# Patient Record
Sex: Male | Born: 2000 | Race: Black or African American | Hispanic: No | Marital: Single | Smoking: Former smoker
Health system: Southern US, Community
[De-identification: ages and names within clinical notes are randomized; demographics above are authoritative.]

## PROBLEM LIST (undated history)

## (undated) DIAGNOSIS — N5089 Other specified disorders of the male genital organs: Secondary | ICD-10-CM

## (undated) HISTORY — PX: NO PAST SURGERIES: SHX2092

---
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
## 2024-04-07 ENCOUNTER — Other Ambulatory Visit: Payer: Self-pay

## 2024-04-07 ENCOUNTER — Ambulatory Visit
Admission: EM | Admit: 2024-04-07 | Discharge: 2024-04-07 | Disposition: A | Discharge status: Home / Self Care | Attending: Internal Medicine | Admitting: Internal Medicine

## 2024-04-07 DIAGNOSIS — J0110 Acute frontal sinusitis, unspecified: Secondary | ICD-10-CM | POA: Diagnosis not present

## 2024-04-07 DIAGNOSIS — B356 Tinea cruris: Secondary | ICD-10-CM | POA: Diagnosis not present

## 2024-04-07 HISTORY — DX: Other specified disorders of the male genital organs: N50.89

## 2024-04-07 MED ORDER — FLUTICASONE PROPIONATE 50 MCG/ACT NA SUSP: 1.0000 | Freq: Every day | NASAL | 0 refills | Status: DC

## 2024-04-07 MED ORDER — KETOCONAZOLE 2 % EX CREA: 1.0000 | TOPICAL_CREAM | Freq: Every day | CUTANEOUS | 0 refills | Status: AC

## 2024-04-07 MED ORDER — CETIRIZINE HCL 10 MG PO TABS: 10.0000 mg | ORAL_TABLET | Freq: Every day | ORAL | 0 refills | Status: DC

## 2024-04-07 MED ORDER — AMOXICILLIN-POT CLAVULANATE 875-125 MG PO TABS: 1.0000 | ORAL_TABLET | Freq: Two times a day (BID) | ORAL | 0 refills | Status: AC

## 2024-04-07 NOTE — ED Provider Notes (Signed)
 [facility]  Note:  This document was prepared using Dragon voice recognition software and may include unintentional dictation errors.  MRN: [id number] DOB: 2001/09/26 DATE: 04/07/24   Subjective:  Chief Complaint:  Chief Complaint  Patient presents with   Rash     HPI: Thomas Bray is a 23 y.o. male presenting for multiple issues.   First, patient reports sinus pressure/pain for the past 3 weeks. Patient reports frontal headaches. He states he was seen at the ER for the same issue and was prescribed something at that time. He states he lost the medication and was unable to complete it as directed. He reports ongoing sinus pressure and congestion since then. He denies cough or recent fever. Reports possible fever at the early onset weeks ago. He hast not taken anything OTC for his symptoms.   Patient also presents for a rash in his groin/bilateral inner thighs. Reports pruritus, but no pain. He states that the rash avoids his genitals. States he works outside a lot. Denies fever, nausea/vomiting, penile discharge, sore throat, cough, otalgia. Endorses rash, sinus pressure/pain, congestion, headaches. Presents NAD.  Prior to Admission medications   Medication Sig Start Date End Date Taking? Authorizing Provider  amoxicillin-clavulanate (AUGMENTIN) 875-125 MG tablet Take 1 tablet by mouth every 12 (twelve) hours for 7 days. 04/07/24 04/14/24 Yes Delane Wessinger P, PA-C  cetirizine (ZYRTEC) 10 MG tablet Take 1 tablet (10 mg total) by mouth daily. 04/07/24  Yes Katherine Tout P, PA-C  fluticasone (FLONASE) 50 MCG/ACT nasal spray Place 1 spray into both nostrils daily. 04/07/24  Yes Jahmiyah Dullea P, PA-C     No Known Allergies  History:   Past Medical History:  Diagnosis Date   Testicular microlithiasis      History reviewed. No pertinent surgical history.  History reviewed. No pertinent family history.  Social History   Tobacco Use   Smoking status: Former  Substance  Use Topics   Alcohol use: Yes    Comment: social   Drug use: Not Currently    Review of Systems  Constitutional:  Negative for fever.  HENT:  Positive for congestion, sinus pressure and sinus pain. Negative for ear pain and sore throat.   Respiratory:  Negative for cough.   Gastrointestinal:  Negative for abdominal pain, nausea and vomiting.  Genitourinary:  Negative for penile discharge.  Skin:  Positive for rash.     Objective:   Vitals: BP (!) 148/84 (BP Location: Right Arm)   Pulse 68   Temp 99.2 F (37.3 C) (Oral)   Resp 16   SpO2 96%   Physical Exam Constitutional:      General: He is not in acute distress.    Appearance: Normal appearance. He is well-developed and normal weight. He is not ill-appearing or toxic-appearing.  HENT:     Head: Normocephalic and atraumatic.     Right Ear: Tympanic membrane and ear canal normal.     Left Ear: Tympanic membrane and ear canal normal.     Nose:     Right Turbinates: Enlarged.     Left Turbinates: Enlarged.     Right Sinus: Frontal sinus tenderness present.     Left Sinus: Frontal sinus tenderness present.   Cardiovascular:     Rate and Rhythm: Normal rate and regular rhythm.     Heart sounds: Normal heart sounds.  Pulmonary:     Effort: Pulmonary effort is normal.     Breath sounds: Normal breath sounds.     Comments:  Clear to auscultation bilaterally  Abdominal:     General: Bowel sounds are normal.     Palpations: Abdomen is soft.     Tenderness: There is no abdominal tenderness.   Skin:    General: Skin is warm and dry.     Findings: Rash present. Rash is macular and papular.     Comments: Patient has pink maculopapular rash of bilateral inner thighs. Slight flaking noted on exam. No discharge.    Neurological:     General: No focal deficit present.     Mental Status: He is alert.   Psychiatric:        Mood and Affect: Mood and affect normal.     Results:  Labs: No results found for this or any  previous visit (from the past 24 hours).  Radiology: No results found.   UC Course/Treatments:  Procedures: Procedures   Medications Ordered in UC: Medications - No data to display   Assessment and Plan :     ICD-10-CM   1. Tinea cruris  B35.6     2. Acute non-recurrent frontal sinusitis  J01.10      Tinea cruris Afebrile, nontoxic-appearing, NAD. VSS. DDX includes but not limited to: tinea cruris, intertrigo, cellulitis, abscess, STD Rash appears consistent with tinea cruris. Ketoconazole 2% 1 application every day was prescribed. Patient instructed to keep the area dry as possible while working. Strict ED precautions were given and patient verbalized understanding.  Acute non-recurrent frontal sinusitis Afebrile, nontoxic-appearing, NAD. VSS. DDX includes but not limited to: COVID, flu, viral URI, sinusitis, allergic rhinitis Patient was given RX for Augmentin per the EMR for frontal sinusitis that was never completed. Patient reports losing the medication after a few doses. RX was sent for Augmentin 875mg  BID given ongoing symptoms. He was also prescribed Flonase 1 spray each nostril every day and Zyrtec 10mg  every day for congestion for suspect allergy component. Strict ED precautions were given and patient verbalized understanding.  ED Discharge Orders          Ordered    amoxicillin-clavulanate (AUGMENTIN) 875-125 MG tablet  Every 12 hours        04/07/24 1107    fluticasone (FLONASE) 50 MCG/ACT nasal spray  Daily        04/07/24 1107    cetirizine (ZYRTEC) 10 MG tablet  Daily        04/07/24 1107    ketoconazole (NIZORAL) 2 % cream  Daily        04/07/24 1108             PDMP not reviewed this encounter.     Darby Fleeman P, PA-C 04/07/24 1120

## 2024-04-07 NOTE — Discharge Instructions (Addendum)
 You have been prescribed ketoconazole cream for your rash. This is an antifungal often used to treat jock itch. Please take as directed.   I have also resent the prescription for Augmentin that the ED prescribed you for a sinus infection. Please take as directed. I have also sent a nasal spray and antihistamine to help with the pressure and congestion.

## 2024-04-07 NOTE — ED Triage Notes (Signed)
 C/O rash to groin area x 2 weeks. Patient also complaining of nasal congestion for more then 3 weeks.

## 2024-05-15 DIAGNOSIS — E7800 Pure hypercholesterolemia, unspecified: Secondary | ICD-10-CM | POA: Insufficient documentation

## 2024-05-30 ENCOUNTER — Ambulatory Visit: Admission: EM | Admit: 2024-05-30 | Discharge: 2024-05-30 | Disposition: A | Discharge status: Home / Self Care

## 2024-05-30 DIAGNOSIS — Z711 Person with feared health complaint in whom no diagnosis is made: Secondary | ICD-10-CM

## 2024-05-30 NOTE — Discharge Instructions (Signed)
 The bumps you have around the head of your penis are called Fordyce spots.  He had similar bumps around the edges of your lips as well.  They are sebaceous glands that provide moisture to the skin around them and are visibly noticeable in about 90% of the human population.  They do not require treatment.  Thank you for visiting [facility] today.

## 2024-05-30 NOTE — ED Triage Notes (Signed)
 Pt states he has had a rash in his groin that has been going on for about a month. He has had itching and swelling in the area of the rash. He has tried the two cream that were prescribed and the second one helped some.

## 2024-05-30 NOTE — ED Provider Notes (Signed)
 [facility]    CSN: [id number] Arrival date & time: 05/30/24  1541    HISTORY   Chief Complaint  Patient presents with   Rash   HPI Thomas Bray is a pleasant, 23 y.o. male who presents to urgent care today. Patient complains of a rash at the base of the head of his penis that has been present for about a month.  States he has never noticed it before.  Patient states the area has been tingly and he has been applying cream to the area without meaningful relief.  Of note, patient was diagnosed with tinea cruris several months ago, has been treated with topical ketoconazole , topical terbinafine in terconazole p.o.  States this rash has resolved.  The history is provided by the patient.  Rash  Past Medical History:  Diagnosis Date   Testicular microlithiasis    Patient Active Problem List   Diagnosis Date Noted   Pure hypercholesterolemia 05/15/2024   Cyst of skin 08/21/2013   Distal radius fracture 07/03/2013   ADHD (attention deficit hyperactivity disorder) 05/30/2013   Oppositional defiant disorder 05/30/2013   Past Surgical History:  Procedure Laterality Date   NO PAST SURGERIES      Home Medications    Prior to Admission medications   Medication Sig Start Date End Date Taking? Authorizing Provider  methocarbamol (ROBAXIN) 500 MG tablet Take 500 mg by mouth. Patient taking differently: Take 500 mg by mouth daily as needed. 03/11/24  Yes [provider]    Family History History reviewed. No pertinent family history. Social History Social History   Tobacco Use   Smoking status: Former    Passive exposure: Current  Vaping Use   Vaping status: Never Used  Substance Use Topics   Alcohol use: Yes    Comment: social   Drug use: Not Currently   Allergies   Cetirizine   Review of Systems Review of Systems  Skin:  Positive for rash.   Pertinent findings revealed after performing a 14 point review of systems has been noted in the history of  present illness.  Physical Exam Vital Signs BP (!) 149/76 (BP Location: Right Arm)   Pulse 66   Temp 99 F (37.2 C) (Oral)   Resp 16   SpO2 96%   No data found.  Physical Exam Vitals and nursing note reviewed.  Constitutional:      General: He is awake. He is not in acute distress.    Appearance: Normal appearance. He is well-developed and well-groomed.  Genitourinary:    Comments: Fordyce spots appreciated at the base of glans penis without surrounding erythema or irritation. Neurological:     Mental Status: He is alert.  Psychiatric:        Behavior: Behavior is cooperative.     Visual Acuity Right Eye Distance:   Left Eye Distance:   Bilateral Distance:    Right Eye Near:   Left Eye Near:    Bilateral Near:     UC Couse / Diagnostics / Procedures:     Radiology No results found.  Procedures Procedures (including critical care time) EKG  Pending results:  Labs Reviewed - No data to display  Medications Ordered in UC: Medications - No data to display  UC Diagnoses / Final Clinical Impressions(s)   I have reviewed the triage vital signs and the nursing notes.  Pertinent labs & imaging results that were available during my care of the patient were reviewed by me and considered in my  medical decision making (see chart for details).    Final diagnoses:  Physically well but worried   Patient advised that the bumps he is concerned about are sebaceous glands and part of his normal anatomy.  No treatment indicated.  Return precautions advised.  Please see discharge instructions below for details of plan of care as provided to patient. ED Prescriptions   None    PDMP not reviewed this encounter.  Pending results:  Labs Reviewed - No data to display    Discharge Instructions      The bumps you have around the head of your penis are called Fordyce spots.  He had similar bumps around the edges of your lips as well.  They are sebaceous glands that  provide moisture to the skin around them and are visibly noticeable in about 90% of the human population.  They do not require treatment.  Thank you for visiting [facility] today.    Disposition Upon Discharge:  Condition: stable for discharge home  Patient presented with an acute illness with associated systemic symptoms and significant discomfort requiring urgent management. In my opinion, this is a condition that a prudent lay person (someone who possesses an average knowledge of health and medicine) may potentially expect to result in complications if not addressed urgently such as respiratory distress, impairment of bodily function or dysfunction of bodily organs.   Routine symptom specific, illness specific and/or disease specific instructions were discussed with the patient and/or caregiver at length.   As such, the patient has been evaluated and assessed, work-up was performed and treatment was provided in alignment with urgent care protocols and evidence based medicine.  Patient/parent/caregiver has been advised that the patient may require follow up for further testing and treatment if the symptoms continue in spite of treatment, as clinically indicated and appropriate.  Patient/parent/caregiver has been advised to return to the [facility] or PCP if no better; to PCP or the Emergency Department if new signs and symptoms develop, or if the current signs or symptoms continue to change or worsen for further workup, evaluation and treatment as clinically indicated and appropriate  The patient will follow up with their current PCP if and as advised. If the patient does not currently have a PCP we will assist them in obtaining one.   The patient may need specialty follow up if the symptoms continue, in spite of conservative treatment and management, for further workup, evaluation, consultation and treatment as clinically indicated and appropriate.  Patient/parent/caregiver verbalized  understanding and agreement of plan as discussed.  All questions were addressed during visit.  Please see discharge instructions below for further details of plan.  This office note has been dictated using Teaching laboratory technician.  Unfortunately, this method of dictation can sometimes lead to typographical or grammatical errors.  I apologize for your inconvenience in advance if this occurs.  Please do not hesitate to reach out to me if clarification is needed.      Joesph Shaver Scales, PA-C 05/30/24 1620

## 2024-09-04 ENCOUNTER — Encounter: Payer: Self-pay | Admitting: Emergency Medicine

## 2024-09-04 ENCOUNTER — Ambulatory Visit
Admission: EM | Admit: 2024-09-04 | Discharge: 2024-09-04 | Disposition: A | Discharge status: Home / Self Care | Attending: Internal Medicine | Admitting: Internal Medicine

## 2024-09-04 DIAGNOSIS — R590 Localized enlarged lymph nodes: Secondary | ICD-10-CM | POA: Diagnosis not present

## 2024-09-04 LAB — POCT URINE DIPSTICK
Bilirubin, UA: NEGATIVE
Blood, UA: NEGATIVE
Glucose, UA: NEGATIVE mg/dL
Ketones, POC UA: NEGATIVE mg/dL
Leukocytes, UA: NEGATIVE
Nitrite, UA: NEGATIVE
POC PROTEIN,UA: NEGATIVE
Spec Grav, UA: 1.025 (ref 1.010–1.025)
Urobilinogen, UA: 1 U/dL
pH, UA: 6.5 (ref 5.0–8.0)

## 2024-09-04 NOTE — ED Triage Notes (Signed)
 Patient c/o a knot in his right lower abdominal area x 5 days.  Denies any pain.  The area is tender to touch.  Denies any OTC meds.

## 2024-09-04 NOTE — Discharge Instructions (Signed)
 Your swab and blood work were sent to the lab for further testing. Someone from our office will call you with your results.  You should avoid all sexual activity until you have been notified of all your results and have undergone any necessary treatment.  If you are positive, it is recommended that you inform all sexual partners so they can treat be treated as well before having sex again.

## 2024-09-04 NOTE — ED Provider Notes (Signed)
 [facility]  Note:  This document was prepared using Dragon voice recognition software and may include unintentional dictation errors.  MRN: [id number] DOB: 2001/02/06 DATE: 09/04/24   Subjective:  Chief Complaint:  Chief Complaint  Patient presents with   Hernia     HPI: Thomas Bray is a 23 y.o. male presenting for knot of his right lower abdomen for the past 5 days. Patient concerned about possible hernia. Patient states he noticed the swelling while in the shower 5 days ago. Reports no pain unless palpated. He has not taken anything for the pain. Reports no known injury or inciting cause.  Of note, patient was diagnosed with gonorrhea at the end of October 2025.  Reports completing treatment as directed and symptoms resolved.  Denies fever, nausea/vomiting, abdominal pain, dysuria, penile discharge. Endorses swelling. Presents NAD.  Prior to Admission medications   Not on File     Allergies  Allergen Reactions   Cetirizine  Hives and Other (See Comments)    unknown    History:   Past Medical History:  Diagnosis Date   Testicular microlithiasis      Past Surgical History:  Procedure Laterality Date   NO PAST SURGERIES      History reviewed. No pertinent family history.  Social History   Tobacco Use   Smoking status: Former    Passive exposure: Current   Smokeless tobacco: Never  Vaping Use   Vaping status: Never Used  Substance Use Topics   Alcohol use: Yes    Comment: social   Drug use: Not Currently    Review of Systems  Constitutional:  Negative for fever.  Gastrointestinal:  Negative for abdominal pain, nausea and vomiting.  Genitourinary:  Negative for dysuria, genital sores, penile discharge and testicular pain.  Hematological:  Positive for adenopathy.     Objective:   Vitals: BP (!) 145/71 (BP Location: Right Arm)   Pulse (!) 55   Temp 98.2 F (36.8 C) (Oral)   Resp 18   Ht 6' 1 (1.854 m)   Wt 170 lb (77.1 kg)   SpO2 95%    BMI 22.43 kg/m   Physical Exam Exam conducted with a chaperone present [employer], [place]).  Constitutional:      General: He is not in acute distress.    Appearance: Normal appearance. He is well-developed and normal weight. He is not ill-appearing or toxic-appearing.  HENT:     Head: Normocephalic and atraumatic.  Cardiovascular:     Rate and Rhythm: Normal rate and regular rhythm.     Heart sounds: Normal heart sounds.  Pulmonary:     Effort: Pulmonary effort is normal.     Breath sounds: Normal breath sounds.     Comments: Clear to auscultation bilaterally  Abdominal:     General: Bowel sounds are normal.     Palpations: Abdomen is soft.     Tenderness: There is no abdominal tenderness.     Hernia: No hernia is present. There is no hernia in the left inguinal area, right femoral area, left femoral area or right inguinal area.     Comments: No acute abdomen   Genitourinary:    Penis: Normal.      Testes: Normal.     Comments: Patient has right sided inguinal lymphadenopathy. TTP. Lymphadenopathy:     Lower Body: Right inguinal adenopathy present.  Skin:    General: Skin is warm and dry.  Neurological:     General: No focal deficit present.  Mental Status: He is alert.  Psychiatric:        Mood and Affect: Mood and affect normal.     Results:  Labs: Results for orders placed or performed during the hospital encounter of 09/04/24 (from the past 24 hours)  POCT URINE DIPSTICK     Status: None   Collection Time: 09/04/24 10:44 AM  Result Value Ref Range   Color, UA yellow yellow   Clarity, UA clear clear   Glucose, UA negative negative mg/dL   Bilirubin, UA negative negative   Ketones, POC UA negative negative mg/dL   Spec Grav, UA 8.974 8.989 - 1.025   Blood, UA negative negative   pH, UA 6.5 5.0 - 8.0   POC PROTEIN,UA negative negative, trace   Urobilinogen, UA 1.0 0.2 or 1.0 E.U./dL   Nitrite, UA Negative Negative   Leukocytes, UA Negative Negative     Radiology: No results found.   UC Course/Treatments:  Procedures: Procedures   Medications Ordered in UC: Medications - No data to display   Assessment and Plan :     ICD-10-CM   1. Inguinal lymphadenopathy  R59.0 CBC with Differential/Platelet    POCT URINE DIPSTICK    Cytology Ancillary Only -Urethral; GC / Chlamydia, Trichomonas    RPR    HIV Antibody (routine testing w rflx)    HIV4GL Save Tube    Mycoplasma genitalium, NAA, Swab (Send to [facility] thru [facility])    CBC with Differential/Platelet    POCT URINE DIPSTICK    Cytology Ancillary Only -Urethral; GC / Chlamydia, Trichomonas    RPR    HIV Antibody (routine testing w rflx)    HIV4GL Save Tube    Mycoplasma genitalium, NAA, Swab (Send to [facility] thru [facility])     Inguinal lymphadenopathy Afebrile, nontoxic-appearing, NAD. VSS. DDX includes but not limited to: STD, HIV, RPR, cystitis Right sided inguinal lymphadenopathy.  Patient is currently asymptomatic except for tenderness to palpation of the lymph nodes.  Patient was recently diagnosed with gonorrhea in October 2025, but reports he completed treatment and symptoms had resolved.  Patient is sexually active and does have unprotected sex.  HIV, RPR, cytology, and mycoplasma are pending given inguinal lymphadenopathy.  CBC is pending to evaluate WBC. UA was negative.  Safe sex precautions advised.  If workup is negative, recommend he follow-up with PCP for further evaluation and possible referral. Recommend OTC analgesics as needed for pain. Strict ED precautions were given and patient verbalized understanding.   ED Discharge Orders     None        PDMP not reviewed this encounter.     Basilia Ulanda SQUIBB, PA-C 09/04/24 1107

## 2024-09-05 LAB — CYTOLOGY, (ORAL, ANAL, URETHRAL) ANCILLARY ONLY
Comment: NEGATIVE
Comment: NEGATIVE
Comment: NORMAL

## 2024-09-05 LAB — CBC WITH DIFFERENTIAL/PLATELET
Basophils Absolute: 0 x10E3/uL (ref 0.0–0.2)
Basos: 1 %
EOS (ABSOLUTE): 0.2 x10E3/uL (ref 0.0–0.4)
Eos: 5 %
Hematocrit: 45.4 % (ref 37.5–51.0)
Hemoglobin: 15.1 g/dL (ref 13.0–17.7)
Immature Grans (Abs): 0 x10E3/uL (ref 0.0–0.1)
Immature Granulocytes: 0 %
Lymphocytes Absolute: 1.3 x10E3/uL (ref 0.7–3.1)
Lymphs: 34 %
MCH: 29.8 pg (ref 26.6–33.0)
MCHC: 33.3 g/dL (ref 31.5–35.7)
MCV: 90 fL (ref 79–97)
Monocytes Absolute: 0.4 x10E3/uL (ref 0.1–0.9)
Monocytes: 9 %
Neutrophils Absolute: 2 x10E3/uL (ref 1.4–7.0)
Neutrophils: 51 %
Platelets: 167 x10E3/uL (ref 150–450)
RBC: 5.06 x10E6/uL (ref 4.14–5.80)
RDW: 12.7 % (ref 11.6–15.4)
WBC: 3.9 x10E3/uL (ref 3.4–10.8)

## 2024-09-05 LAB — SYPHILIS: RPR W/REFLEX TO RPR TITER AND TREPONEMAL ANTIBODIES, TRADITIONAL SCREENING AND DIAGNOSIS ALGORITHM: RPR Ser Ql: NONREACTIVE

## 2024-09-05 LAB — HIV ANTIBODY (ROUTINE TESTING W REFLEX): HIV Screen 4th Generation wRfx: NONREACTIVE

## 2024-09-07 ENCOUNTER — Ambulatory Visit: Payer: Self-pay | Admitting: Internal Medicine

## 2024-09-12 LAB — MISC LABCORP TEST (SEND OUT): Labcorp test code: 180076

## 2024-10-19 ENCOUNTER — Ambulatory Visit
Admission: EM | Admit: 2024-10-19 | Discharge: 2024-10-19 | Disposition: A | Discharge status: Home / Self Care | Attending: Internal Medicine | Admitting: Internal Medicine

## 2024-10-19 ENCOUNTER — Other Ambulatory Visit: Payer: Self-pay

## 2024-10-19 DIAGNOSIS — R197 Diarrhea, unspecified: Secondary | ICD-10-CM | POA: Diagnosis present

## 2024-10-19 MED ORDER — LOPERAMIDE HCL 2 MG PO CAPS: ORAL_CAPSULE | ORAL | 0 refills | Status: AC

## 2024-10-19 NOTE — ED Provider Notes (Signed)
 " [facility]  Note:  This document was prepared using Dragon voice recognition software and may include unintentional dictation errors.  MRN: [id number] DOB: 2000/11/17 DATE: 10/19/2024   Subjective:  Chief Complaint:  Chief Complaint  Patient presents with   Diarrhea     HPI: Thomas Bray is a 24 y.o. male presenting for diarrhea for the past 5 days. Patient states he woke up in the middle of the night on Sunday and had abdominal cramping. He states he had an episode of diarrhea when he went to the restroom. Reports ongoing diarrhea since then. He reports loose, watery stools with no blood. Reports 2-3 bowel movements a day. Last BM was this morning and diarrhea in consistency. He reports intermittent abdominal cramping. Diarrhea usually occurs after eating. No one with similar symptoms. No recent travel out the country or ABX use. He has not taken anything for his symptoms. Reports eating crackers and drinking juice. Denies fever, nausea/vomiting, abdominal pain, bloody stools. Endorses diarrhea. Presents NAD.  Prior to Admission medications  Not on File     Allergies[1]  History:   Past Medical History:  Diagnosis Date   Testicular microlithiasis      Past Surgical History:  Procedure Laterality Date   NO PAST SURGERIES      History reviewed. No pertinent family history.  Social History[2]  Review of Systems  Constitutional:  Positive for appetite change. Negative for fever.  Gastrointestinal:  Positive for diarrhea. Negative for abdominal pain, blood in stool, nausea and vomiting.     Objective:   Vitals: BP (!) 146/91 (BP Location: Right Arm)   Pulse (!) 51   Temp 98.4 F (36.9 C) (Oral)   Resp 16   SpO2 97%   Physical Exam Constitutional:      General: He is not in acute distress.    Appearance: Normal appearance. He is well-developed and normal weight. He is not ill-appearing or toxic-appearing.  HENT:     Head: Normocephalic and atraumatic.   Cardiovascular:     Rate and Rhythm: Normal rate and regular rhythm.     Heart sounds: Normal heart sounds.  Pulmonary:     Effort: Pulmonary effort is normal.     Breath sounds: Normal breath sounds.     Comments: Clear to auscultation bilaterally  Abdominal:     General: Bowel sounds are normal.     Palpations: Abdomen is soft.     Tenderness: There is no abdominal tenderness.     Comments: No acute abdomen   Skin:    General: Skin is warm and dry.  Neurological:     General: No focal deficit present.     Mental Status: He is alert.  Psychiatric:        Mood and Affect: Mood and affect normal.     Results:  Labs: No results found for this or any previous visit (from the past 24 hours).  Radiology: No results found.   UC Course/Treatments:  Procedures: Procedures   Medications Ordered in UC: Medications - No data to display   Assessment and Plan :     ICD-10-CM   1. Diarrhea, unspecified type  R19.7 Comprehensive metabolic panel    CBC with Differential/Platelet    Comprehensive metabolic panel    CBC with Differential/Platelet     Diarrhea, unspecified type Afebrile, nontoxic-appearing, NAD. VSS. DDX includes but not limited to: gastroenteritis, colitis, IBD, IBS, e.coli. viral etiology GI panel and c.diff panel pending given length of  symptoms. Patient declined CBC and CMP to evaluate WBC and electrolytes/organ function. Recommend BRAT diet and increase fluids. Loperamide  2mg  as directed was prescribed. Will adjust treatment plan based on cytology results. No acute abdomen on exam and nontender to palpation. Suspect viral etiology. Strict ED precautions were given and patient verbalized understanding.  ED Discharge Orders          Ordered    Gastrointestinal Panel by PCR , Stool        10/19/24 1143    C Difficile Quick Screen w PCR reflex        10/19/24 1143    loperamide  (IMODIUM ) 2 MG capsule        10/19/24 1144             PDMP not  reviewed this encounter.     [1]  Allergies Allergen Reactions   Cetirizine  Hives and Other (See Comments)    unknown  [2]  Social History Tobacco Use   Smoking status: Former    Passive exposure: Current   Smokeless tobacco: Never  Vaping Use   Vaping status: Never Used  Substance Use Topics   Alcohol use: Yes    Comment: social   Drug use: Not Currently     Basilia Ulanda SQUIBB, PA-C 10/19/24 1152  "

## 2024-10-19 NOTE — ED Triage Notes (Signed)
 C/O watery diarrhea x 5 days. Patient C/O abdomin al cramping. Patient states symptoms x 5 days. Denies nausea or vomiting. Denies fever. Patient has not taken any OTC meds for symptoms.

## 2024-10-19 NOTE — Discharge Instructions (Addendum)
 Recommend that you increase oral fluids such as water and Pedialyte. A bland diet is recommend at this time such as the B.R.A.T diet (Broth, Rice, Applesauce, Toast) as tolerated.  You have been prescribed Loperamide  for your diarrhea. This is a prescription often used to treat diarrhea. Please take as directed.  Please return your stool sample and we will send for testing.   Please noted our office is limited in the tests and imaging we can perform at our facility. Your evaluation was not suggestive of any emergent condition requiring medical intervention at this time. However, some abdominal problems make take more time to appear. Therefore, it is very important for you to pay attention to any new symptoms or worsening of your current condition.   Please go directly to the Emergency Department immediately should you begin to feel worse in any way or have any of the following symptoms: increasing or different abdominal pain, persistent vomiting, inability to drink fluids, persistent fevers, bloody bowel movements, or begin vomiting blood.

## 2024-10-20 LAB — C DIFFICILE QUICK SCREEN W PCR REFLEX
C Diff antigen: NEGATIVE
C Diff interpretation: NOT DETECTED
C Diff toxin: NEGATIVE

## 2024-10-25 ENCOUNTER — Other Ambulatory Visit: Payer: Self-pay

## 2024-10-25 ENCOUNTER — Ambulatory Visit
Admission: EM | Admit: 2024-10-25 | Discharge: 2024-10-25 | Disposition: A | Discharge status: Home / Self Care | Attending: Internal Medicine | Admitting: Internal Medicine

## 2024-10-25 DIAGNOSIS — Z113 Encounter for screening for infections with a predominantly sexual mode of transmission: Secondary | ICD-10-CM | POA: Diagnosis not present

## 2024-10-25 DIAGNOSIS — R197 Diarrhea, unspecified: Secondary | ICD-10-CM | POA: Insufficient documentation

## 2024-10-25 DIAGNOSIS — R369 Urethral discharge, unspecified: Secondary | ICD-10-CM | POA: Diagnosis not present

## 2024-10-25 NOTE — ED Notes (Signed)
 Stool specimen kit given and explained to patient. Patient discharged to home and advised to return specimen as soon as possible.

## 2024-10-25 NOTE — Discharge Instructions (Signed)
 Recommend that you increase oral fluids such as water and Pedialyte. A bland diet is recommend at this time such as the B.R.A.T diet (Broth, Rice, Applesauce, Toast) as tolerated. Please return with your sample and we will resend it to the lab. I sent Imodium  to your pharmacy last time. If the pharmacy does not have it, then you can purchase over the counter.   Finally, you swab was sent to the lab for further testing.  You will be called with results. You should avoid all sexual activity until you have been notified of all your results and have undergone any necessary treatment.  If you are positive, it is recommended that you inform all sexual partners so they can treat be treated as well before having sex again.

## 2024-10-25 NOTE — ED Provider Notes (Signed)
 " [facility]  Note:  This document was prepared using Dragon voice recognition software and may include unintentional dictation errors.  MRN: [id number] DOB: 21-Aug-2001 DATE: 10/25/24   Subjective:  Chief Complaint:  Chief Complaint  Patient presents with   Diarrhea     HPI: Thomas Bray is a 24 y.o. male presenting for multiple issues.   First, patient was seen on 10/19/2024 for diarrhea. At that time, he reported 5 days of loose, watery stools after eating. He reports some intermittent abdominal cramping prior to bowel movement. He reported no one else with similar symptoms or recent ABX use at that time. Imodium  was prescribed and GI panel and C.diff were ordered. Patient declined blood draw. He presents today for ongoing issue. He reports that he still is having 2-3 loose, watery stools per day after eating. He reports no blood in his stools. He states symptoms are not getting worse, but not improving. Patient states he has not taken any Imodium . He reports going to the pharmacy, but there was no medication. C.diff was negative, but lab stated they did not see an order for the GI panel. However, order was viewable on our side, but sample was too old for testing.   Patient also wanting STD testing. Patient was seen on 09/04/2024 as well. At that time, he was found to have inguinal lymphadenopathy. STD was recommend at that time. HIV and RPR were negative, but swabs were insufficient. Patient was told by call back nurse to return for re-swab. Patient never returned and is trying to re-swab to day. Patient told his order was too old and had to be reordered. Patient reports no known STD exposures. However,he does report intermittent penile discharge. He reports no pain or dysuria. He states he has seen a discharge randomly since he was last seen. Patient reports using condoms since previously tested for STDs.   Denies fever, nausea/vomiting, blood in stool, dysuria. Endorses diarrhea,  penile discharge. Presents NAD.  Prior to Admission medications  Medication Sig Start Date End Date Taking? Authorizing Provider  loperamide  (IMODIUM ) 2 MG capsule 4 mg PO x1, then 2 mg PO x1 after each loose stool Max: 16 mg/day 10/19/24   Dayton Kenley P, PA-C     Allergies[1]  History:   Past Medical History:  Diagnosis Date   Testicular microlithiasis      Past Surgical History:  Procedure Laterality Date   NO PAST SURGERIES      History reviewed. No pertinent family history.  Social History[2]  Review of Systems  Constitutional:  Negative for fever.  Gastrointestinal:  Positive for abdominal pain and diarrhea. Negative for blood in stool, nausea and vomiting.  Genitourinary:  Positive for penile discharge. Negative for dysuria, flank pain and penile pain.     Objective:   Vitals: BP 130/77 (BP Location: Right Arm)   Pulse 61   Temp 99.2 F (37.3 C) (Oral)   Resp 16   SpO2 97%   Physical Exam Constitutional:      General: He is not in acute distress.    Appearance: Normal appearance. He is well-developed and normal weight. He is not ill-appearing or toxic-appearing.  HENT:     Head: Normocephalic and atraumatic.  Cardiovascular:     Rate and Rhythm: Normal rate and regular rhythm.     Heart sounds: Normal heart sounds.  Pulmonary:     Effort: Pulmonary effort is normal.     Breath sounds: Normal breath sounds.  Comments: Clear to auscultation bilaterally  Abdominal:     General: Bowel sounds are normal.     Palpations: Abdomen is soft.     Tenderness: There is no abdominal tenderness. There is no right CVA tenderness or left CVA tenderness.     Comments: No acute abdomen   Skin:    General: Skin is warm and dry.  Neurological:     General: No focal deficit present.     Mental Status: He is alert.  Psychiatric:        Mood and Affect: Mood and affect normal.     Results:  Labs: No results found for this or any previous visit (from the  past 24 hours).  Radiology: No results found.   UC Course/Treatments:  Procedures: Procedures   Medications Ordered in UC: Medications - No data to display   Assessment and Plan :     ICD-10-CM   1. Diarrhea, unspecified type  R19.7 Gastrointestinal Panel by PCR , Stool    2. Screening for STDs (sexually transmitted diseases)  Z11.3     3. Penile discharge  R36.9      Diarrhea, unspecified type Afebrile, nontoxic-appearing, NAD. VSS. DDX includes but not limited to: gastroenteritis, colitis, IBD, IBS, e.coli. viral etiology C.diff was negative from last visit. Lab unable perform GI panel. Recommend patient have retesting. GI panel was re-ordered. Patient encouraged to start Imodium  as discussed at his last visit. He can purchase over the counter if insurance will not cover RX. Recommend BRAT diet and increase fluids. If work up negative, recommend patient follow up with GI doctor. Strict ED precautions were given and patient verbalized understanding.  Screening for STDs (sexually transmitted diseases) Penile discharge Afebrile, nontoxic-appearing, NAD. VSS. DDX includes but not limited to: chlamydia, gonorrhea, trichomoniasis Patient requesting STD testing. Previous sample in 08/2024 was not sufficient. He never returned at that time. Cytology re-ordered. Previously noted inguinal lymphadenopathy. Today, patient reports intermittent discharge from his penis. Reports no pain or dysuria. He reports practicing safe sex since his last testing. Reports noticing discharge randomly. ED precautions were given and patient verbalized understanding.   ED Discharge Orders          Ordered    Gastrointestinal Panel by PCR , Stool        10/25/24 1658             PDMP not reviewed this encounter.      [1]  Allergies Allergen Reactions   Cetirizine  Hives and Other (See Comments)    unknown  [2]  Social History Tobacco Use   Smoking status: Former    Passive exposure:  Current   Smokeless tobacco: Never  Vaping Use   Vaping status: Never Used  Substance Use Topics   Alcohol use: Yes    Comment: social   Drug use: Not Currently     Basilia Ulanda SQUIBB, PA-C 10/25/24 1755  "

## 2024-10-25 NOTE — ED Triage Notes (Signed)
 C/O diarrhea for over one week. Denies nausea    or vomiting. Patient states he did not fill Rx for imodium .

## 2024-10-26 ENCOUNTER — Ambulatory Visit (HOSPITAL_COMMUNITY): Payer: Self-pay

## 2024-10-26 ENCOUNTER — Ambulatory Visit
Admission: EM | Admit: 2024-10-26 | Discharge: 2024-10-26 | Disposition: A | Discharge status: Home / Self Care | Attending: Emergency Medicine | Admitting: Emergency Medicine

## 2024-10-26 DIAGNOSIS — A549 Gonococcal infection, unspecified: Secondary | ICD-10-CM

## 2024-10-26 LAB — CYTOLOGY, (ORAL, ANAL, URETHRAL) ANCILLARY ONLY
Chlamydia: NEGATIVE
Comment: NEGATIVE
Comment: NEGATIVE
Comment: NORMAL
Neisseria Gonorrhea: POSITIVE — AB
Trichomonas: POSITIVE — AB

## 2024-10-26 MED ORDER — METRONIDAZOLE 500 MG PO TABS: 2000.0000 mg | ORAL_TABLET | Freq: Once | ORAL | 0 refills | Status: AC

## 2024-10-26 MED ORDER — CEFTRIAXONE SODIUM 500 MG IJ SOLR
500.0000 mg | INTRAMUSCULAR | Status: DC
  Administered 2024-10-26: 500 mg via INTRAMUSCULAR

## 2024-10-26 NOTE — ED Triage Notes (Signed)
 The patient presents for a nurse for an injection today.
